# Patient Record
Sex: Female | Born: 2005 | Race: Black or African American | Hispanic: No | Marital: Single | Smoking: Never smoker
Health system: Southern US, Community
[De-identification: ages and names within clinical notes are randomized; demographics above are authoritative.]

---
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
## 2024-03-30 ENCOUNTER — Ambulatory Visit: Admitting: Nurse Practitioner

## 2024-03-30 ENCOUNTER — Encounter: Payer: Self-pay | Admitting: Nurse Practitioner

## 2024-03-30 ENCOUNTER — Other Ambulatory Visit (HOSPITAL_COMMUNITY)
Admission: RE | Admit: 2024-03-30 | Discharge: 2024-03-30 | Disposition: A | Source: Ambulatory Visit | Attending: Nurse Practitioner | Admitting: Nurse Practitioner

## 2024-03-30 DIAGNOSIS — Z114 Encounter for screening for human immunodeficiency virus [HIV]: Secondary | ICD-10-CM

## 2024-03-30 DIAGNOSIS — M25561 Pain in right knee: Secondary | ICD-10-CM | POA: Diagnosis not present

## 2024-03-30 DIAGNOSIS — Z118 Encounter for screening for other infectious and parasitic diseases: Secondary | ICD-10-CM | POA: Insufficient documentation

## 2024-03-30 DIAGNOSIS — Z1159 Encounter for screening for other viral diseases: Secondary | ICD-10-CM

## 2024-03-30 DIAGNOSIS — G8929 Other chronic pain: Secondary | ICD-10-CM

## 2024-03-30 DIAGNOSIS — Z131 Encounter for screening for diabetes mellitus: Secondary | ICD-10-CM

## 2024-03-30 NOTE — Progress Notes (Unsigned)
 "  New Patient Office Visit  Subjective   Patient ID: Kristie Ayala, female    DOB: 2005-09-23  Age: 19 y.o. MRN: [id number]  CC:  Chief Complaint  Patient presents with   Establish Care    HPI Kristie Ayala presents to establish care.  Previous previous PCP was at [facility] pediatric[facility].  She has had intermittent right kneecap pain for about 4-5 years after a fall in which she landed on her knees. Pain is localized to the patella, flares sporadically for about a week at a time, and is worsened by cold weather and activities like dancing or unusual leg movements. IcyHot patches give partial relief, while wearing a knee brace during activity increases pain. She has not had imaging or prior medical evaluation for this.  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 She occasionally drinks alcohol and smokes cigars or pre-rolls, denies other recreational drugs, and is sexually active with a female partner using protection.   She reports stress related to school. Appetite is low and she typically eats once or twice daily with snacks. She denies sleep or bowel issues. Menstrual cycles are regular, with mild delay during periods of stress.  Family history is notable for hypertension and negative for diabetes or cancer. She reports no chronic medical conditions and no prior surgeries.      Health Maintenance  Topic Date Due   Pneumococcal Vaccine (1 of 2 - PCV) 10/16/2022   COVID-19 Vaccine (7 - 2025-26 season) 04/15/2024 (Originally 11/07/2023)   Influenza Vaccine  06/05/2024 (Originally 10/07/2023)   CHLAMYDIA SCREENING  03/30/2025   DTaP/Tdap/Td (8 - Td or Tdap) 12/23/2032   HPV VACCINES  Completed   Hepatitis C Screening  Completed   HIV Screening  Completed   Meningococcal B Vaccine  Completed    There are no preventive care reminders to display for this patient.   Outpatient Encounter Medications as of 03/30/2024  Medication Sig    Multiple Vitamin (MULTIVITAMIN) tablet Take 1 tablet by mouth daily.   [DISCONTINUED] COVID-19 mRNA bivalent vaccine, Pfizer, (PFIZER COVID-19 VAC BIVALENT) injection Inject into the muscle.   No facility-administered encounter medications on file as of 03/30/2024.    History reviewed. No pertinent past medical history.  History reviewed. No pertinent surgical history.  Family History  Problem Relation Age of Onset   Hypertension Mother    Breast cancer Neg Hx    Colon cancer Neg Hx     Social History   Socioeconomic History   Marital status: Single    Spouse name: Not on file   Number of children: Not on file   Years of education: Not on file   Highest education level: Not on file  Occupational History   Not on file  Tobacco Use   Smoking status: Some Days    Types: Cigars   Smokeless tobacco: Never  Vaping Use   Vaping status: Never Used  Substance and Sexual Activity   Alcohol use: Yes    Comment: occasionally   Drug use: Never   Sexual activity: Yes    Partners: Male    Birth control/protection: Condom  Other Topics Concern   Not on file  Social History Narrative   3er year at [employer]. Lives with her mother. No siblings.    Social Drivers of Health   Tobacco Use: High Risk (03/30/2024)   Patient History    Smoking Tobacco Use: Some  Days    Smokeless Tobacco Use: Never    Passive Exposure: Not on file  Financial Resource Strain: Not on file  Food Insecurity: Not on file  Transportation Needs: Not on file  Physical Activity: Not on file  Stress: Not on file  Social Connections: Not on file  Intimate Partner Violence: Not on file  Depression (PHQ2-9): Medium Risk (03/30/2024)   Depression (PHQ2-9)    PHQ-2 Score: 5  Alcohol Screen: Not on file  Housing: Not on file  Utilities: Not on file  Health Literacy: Not on file    ROS Negative unless indicated in HPI.      Objective    BP 116/78   Pulse 77   Temp 98.4 F (36.9 C)   Ht  4' 11 (1.499 m)   Wt 215 lb 9.6 oz (97.8 kg)   LMP 03/18/2024   SpO2 98%   BMI 43.55 kg/m   Physical Exam Constitutional:      Appearance: Normal appearance. She is normal weight.  HENT:     Head: Normocephalic.     Right Ear: Tympanic membrane normal.     Left Ear: Tympanic membrane normal.     Nose: Nose normal.     Mouth/Throat:     Mouth: Mucous membranes are moist.  Eyes:     Extraocular Movements: Extraocular movements intact.     Conjunctiva/sclera: Conjunctivae normal.     Pupils: Pupils are equal, round, and reactive to light.  Neck:     Thyroid: No thyroid mass or thyroid tenderness.  Cardiovascular:     Rate and Rhythm: Normal rate and regular rhythm.     Pulses: Normal pulses.     Heart sounds: Normal heart sounds. No murmur heard. Pulmonary:     Effort: Pulmonary effort is normal.     Breath sounds: Normal breath sounds.  Abdominal:     General: Bowel sounds are normal.     Palpations: Abdomen is soft. There is no mass.     Tenderness: There is no abdominal tenderness. There is no rebound.  Musculoskeletal:        General: No swelling.     Cervical back: Neck supple. No tenderness.     Right lower leg: No edema.     Left lower leg: No edema.  Skin:    General: Skin is warm.     Findings: No bruising or erythema.  Neurological:     General: No focal deficit present.     Mental Status: She is alert and oriented to person, place, and time. Mental status is at baseline.  Psychiatric:        Mood and Affect: Mood normal.        Behavior: Behavior normal.        Thought Content: Thought content normal.        Judgment: Judgment normal.         Assessment & Plan:  Morbid obesity (HCC) Assessment & Plan: Body mass index is 43.55 kg/m. Discussed lifestyle modifications and weight reduction to alleviate knee pain. - Encouraged regular meals and healthy snacking. - Advised reducing sugar intake and soda consumption. - Recommended increasing physical  activity.   - Will check labs as outlined.   Orders: -     CBC with Differential/Platelet -     Comprehensive metabolic panel with GFR -     Lipid panel -     TSH -     Hemoglobin A1c  Encounter for screening for  HIV -     HIV Antibody (routine testing w rflx)  Need for hepatitis C screening test -     Hepatitis C antibody  Screening for diabetes mellitus -     Hemoglobin A1c  Screening for chlamydial disease -     Urine cytology ancillary only  Chronic pain of right knee Assessment & Plan: Intermittent pain localized to kneecap, exacerbated by cold and activity. Icy Hot provides relief, knee brace aggravates. - Use Icy Hot patch for pain relief. - Avoid knee brace if it aggravates pain. - Recommended imaging patient declined at present.  She will let us  know if symptoms not improving.     Return if symptoms worsen or fail to improve.   Chelsea Aurora, NP  "

## 2024-03-31 LAB — URINE CYTOLOGY ANCILLARY ONLY
Chlamydia: NEGATIVE
Comment: NEGATIVE
Comment: NEGATIVE
Comment: NORMAL
Neisseria Gonorrhea: NEGATIVE
Trichomonas: NEGATIVE

## 2024-03-31 LAB — LIPID PANEL

## 2024-04-04 ENCOUNTER — Ambulatory Visit: Payer: Self-pay | Admitting: Nurse Practitioner

## 2024-04-04 LAB — LIPID PANEL
Cholesterol, Total: 154 mg/dL (ref 100–199)
HDL: 46 mg/dL
LDL CALC COMMENT:: 3.3 ratio (ref 0.0–4.4)
LDL Chol Calc (NIH): 97 mg/dL (ref 0–99)
Triglycerides: 55 mg/dL (ref 0–149)
VLDL Cholesterol Cal: 11 mg/dL (ref 5–40)

## 2024-04-04 LAB — CBC WITH DIFFERENTIAL/PLATELET
Basophils Absolute: 0 10*3/uL (ref 0.0–0.2)
Basos: 1 %
EOS (ABSOLUTE): 0.1 10*3/uL (ref 0.0–0.4)
Eos: 1 %
Hematocrit: 40.6 % (ref 34.0–46.6)
Hemoglobin: 13 g/dL (ref 11.1–15.9)
Immature Grans (Abs): 0 10*3/uL (ref 0.0–0.1)
Immature Granulocytes: 0 %
Lymphocytes Absolute: 2.3 10*3/uL (ref 0.7–3.1)
Lymphs: 29 %
MCH: 31.4 pg (ref 26.6–33.0)
MCHC: 32 g/dL (ref 31.5–35.7)
MCV: 98 fL — ABNORMAL HIGH (ref 79–97)
Monocytes Absolute: 0.7 10*3/uL (ref 0.1–0.9)
Monocytes: 9 %
Neutrophils Absolute: 4.8 10*3/uL (ref 1.4–7.0)
Neutrophils: 60 %
Platelets: 346 10*3/uL (ref 150–450)
RBC: 4.14 x10E6/uL (ref 3.77–5.28)
RDW: 12.5 % (ref 11.7–15.4)
WBC: 7.9 10*3/uL (ref 3.4–10.8)

## 2024-04-04 LAB — HEMOGLOBIN A1C
Est. average glucose Bld gHb Est-mCnc: 111 mg/dL
Hgb A1c MFr Bld: 5.5 % (ref 4.8–5.6)

## 2024-04-04 LAB — COMPREHENSIVE METABOLIC PANEL WITH GFR
ALT: 13 [IU]/L (ref 0–32)
AST: 13 [IU]/L (ref 0–40)
Albumin: 4 g/dL (ref 4.0–5.0)
Alkaline Phosphatase: 90 [IU]/L (ref 42–106)
BUN/Creatinine Ratio: 11 (ref 9–23)
BUN: 8 mg/dL (ref 6–20)
Bilirubin Total: <0.2 mg/dL (ref 0.0–1.2)
CO2: 22 mmol/L (ref 20–29)
Calcium: 9.2 mg/dL (ref 8.7–10.2)
Chloride: 103 mmol/L (ref 96–106)
Creatinine, Ser: 0.72 mg/dL (ref 0.57–1.00)
Globulin, Total: 2.3 g/dL (ref 1.5–4.5)
Glucose: 91 mg/dL (ref 70–99)
Potassium: 4.2 mmol/L (ref 3.5–5.2)
Sodium: 139 mmol/L (ref 134–144)
Total Protein: 6.3 g/dL (ref 6.0–8.5)
eGFR: 123 mL/min/{1.73_m2}

## 2024-04-04 LAB — HIV ANTIBODY (ROUTINE TESTING W REFLEX): HIV Screen 4th Generation wRfx: NONREACTIVE

## 2024-04-04 LAB — TSH: TSH: 1.67 u[IU]/mL (ref 0.450–4.500)

## 2024-04-04 LAB — HEPATITIS C ANTIBODY: Hep C Virus Ab: NONREACTIVE

## 2024-04-05 DIAGNOSIS — Z114 Encounter for screening for human immunodeficiency virus [HIV]: Secondary | ICD-10-CM | POA: Insufficient documentation

## 2024-04-05 DIAGNOSIS — Z1159 Encounter for screening for other viral diseases: Secondary | ICD-10-CM | POA: Insufficient documentation

## 2024-04-05 DIAGNOSIS — Z131 Encounter for screening for diabetes mellitus: Secondary | ICD-10-CM | POA: Insufficient documentation

## 2024-04-05 DIAGNOSIS — G8929 Other chronic pain: Secondary | ICD-10-CM | POA: Insufficient documentation

## 2024-04-05 NOTE — Assessment & Plan Note (Addendum)
 Body mass index is 43.55 kg/m. Discussed lifestyle modifications and weight reduction to alleviate knee pain. - Encouraged regular meals and healthy snacking. - Advised reducing sugar intake and soda consumption. - Recommended increasing physical activity.   - Will check labs as outlined.

## 2024-04-05 NOTE — Assessment & Plan Note (Signed)
 Intermittent pain localized to kneecap, exacerbated by cold and activity. Icy Hot provides relief, knee brace aggravates. - Use Icy Hot patch for pain relief. - Avoid knee brace if it aggravates pain. - Recommended imaging patient declined at present.  She will let us  know if symptoms not improving.
